# Patient Record
Sex: Male | Born: 1990 | Race: Black or African American | Hispanic: No | Marital: Single | Smoking: Current every day smoker
Health system: Southern US, Community
[De-identification: ages and names within clinical notes are randomized; demographics above are authoritative.]

## PROBLEM LIST (undated history)

## (undated) DIAGNOSIS — K219 Gastro-esophageal reflux disease without esophagitis: Secondary | ICD-10-CM

## (undated) DIAGNOSIS — J45909 Unspecified asthma, uncomplicated: Secondary | ICD-10-CM

---
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
## 2010-02-03 ENCOUNTER — Emergency Department (HOSPITAL_COMMUNITY): Admission: EM | Admit: 2010-02-03 | Discharge: 2010-02-03 | Payer: Self-pay | Admitting: Emergency Medicine

## 2010-08-04 LAB — URINALYSIS, ROUTINE W REFLEX MICROSCOPIC: Glucose, UA: NEGATIVE mg/dL

## 2010-08-04 LAB — URINE MICROSCOPIC-ADD ON

## 2013-08-17 ENCOUNTER — Encounter (HOSPITAL_COMMUNITY): Payer: Self-pay | Admitting: Emergency Medicine

## 2013-08-17 ENCOUNTER — Emergency Department (HOSPITAL_COMMUNITY): Payer: Self-pay

## 2013-08-17 ENCOUNTER — Emergency Department (HOSPITAL_COMMUNITY)
Admission: EM | Admit: 2013-08-17 | Discharge: 2013-08-17 | Disposition: A | Discharge status: Home / Self Care | Payer: Self-pay | Attending: Emergency Medicine | Admitting: Emergency Medicine

## 2013-08-17 DIAGNOSIS — M940 Chondrocostal junction syndrome [Tietze]: Secondary | ICD-10-CM | POA: Insufficient documentation

## 2013-08-17 DIAGNOSIS — F17200 Nicotine dependence, unspecified, uncomplicated: Secondary | ICD-10-CM | POA: Insufficient documentation

## 2013-08-17 MED ORDER — HYDROCODONE-ACETAMINOPHEN 5-325 MG PO TABS
2.0000 | ORAL_TABLET | Freq: Once | ORAL | Status: AC
  Administered 2013-08-17: 2 via ORAL
  Filled 2013-08-17: qty 2

## 2013-08-17 MED ORDER — HYDROCODONE-ACETAMINOPHEN 5-325 MG PO TABS: 1.0000 | ORAL_TABLET | ORAL | Status: DC | PRN

## 2013-08-17 MED ORDER — IBUPROFEN 800 MG PO TABS: 800.0000 mg | ORAL_TABLET | Freq: Three times a day (TID) | ORAL | Status: DC

## 2013-08-17 MED ORDER — KETOROLAC TROMETHAMINE 60 MG/2ML IM SOLN
60.0000 mg | Freq: Once | INTRAMUSCULAR | Status: AC
  Administered 2013-08-17: 60 mg via INTRAMUSCULAR
  Filled 2013-08-17: qty 2

## 2013-08-17 MED ORDER — GI COCKTAIL ~~LOC~~
30.0000 mL | Freq: Once | ORAL | Status: AC
  Administered 2013-08-17: 30 mL via ORAL
  Filled 2013-08-17: qty 30

## 2013-08-17 NOTE — ED Notes (Signed)
The pt has had a cough for approx one week productive cough yellowish thick or thin sputum.  No temp he feels bad

## 2013-08-17 NOTE — ED Notes (Signed)
Onset 2 weeks NP cough.  Onset today mid chest pain when taking deep breath, eating foods and swallowing.  No respiratory difficulties or trouble swallowing saliva.  Lungs clear.

## 2013-08-17 NOTE — ED Provider Notes (Signed)
CSN: [id number]     Arrival date & time 08/17/13  1643 History   First MD Initiated Contact with Patient 08/17/13 1659     Chief Complaint  Patient presents with  . Cough     (Consider location/radiation/quality/duration/timing/severity/associated sxs/prior Treatment) HPI History provided by pt.   Pt presents w/ constant, non-radiating, pleuritic, mid-line CP since this morning.  No aggravating/alleviating factors other than pain increases with po intake.  Associated w/ cough x 2 weeks.  Denies fever, SOB, abd pain, N/V.  No recent trauma or heavy lifting.  No h/o GERD.  Drank a small amount of alcohol last night.  No spicy food, caffeine or NSAIDs recently.  History reviewed. No pertinent past medical history. History reviewed. No pertinent past surgical history. No family history on file. History  Substance Use Topics  . Smoking status: Current Every Day Smoker  . Smokeless tobacco: Not on file  . Alcohol Use: Yes    Review of Systems  All other systems reviewed and are negative.      Allergies  Review of patient's allergies indicates no known allergies.  Home Medications  No current outpatient prescriptions on file. BP 125/70  Pulse 94  Temp(Src) 97.4 F (36.3 C) (Oral)  Resp 18  Wt 144 lb 5 oz (65.46 kg)  SpO2 98% Physical Exam  Nursing note and vitals reviewed. Constitutional: He is oriented to person, place, and time. He appears well-developed and well-nourished.  Uncomfortable appearing  HENT:  Head: Normocephalic and atraumatic.  Eyes:  Normal appearance  Neck: Normal range of motion.  Cardiovascular: Normal rate, regular rhythm and intact distal pulses.   Pulmonary/Chest: Effort normal and breath sounds normal. No respiratory distress.  No pleuritic pain reported.  Tenderness LSB.    Abdominal: Soft. Bowel sounds are normal. He exhibits no distension. There is no tenderness. There is no guarding.  Musculoskeletal: Normal range of motion.  No peripheral  edema or calf tenderness  Neurological: He is alert and oriented to person, place, and time.  Skin: Skin is warm and dry. No rash noted.  Psychiatric: He has a normal mood and affect. His behavior is normal.    ED Course  Procedures (including critical care time) Labs Review Labs Reviewed - No data to display Imaging Review Dg Chest 2 View  08/17/2013   CLINICAL DATA:  Cough for 2 weeks.  Chest pain.  EXAM: CHEST  2 VIEW  COMPARISON:  None.  FINDINGS: Normal heart, mediastinum and hila. Clear lungs. No pleural effusion or pneumothorax. Bony thorax and soft tissues are normal.  IMPRESSION: Normal chest radiographs.   Electronically Signed   By: Amie Portlandavid  Ormond M.D.   On: 08/17/2013 18:43     EKG Interpretation None      MDM   Final diagnoses:  Costochondritis    22yo M presents w/ non-traumatic CP since this am.  H/o GERD but this pain feels different.  Cough x 2 weeks.  Afebrile, NAD, nml breath sounds, tenderness LSB, no edema/tenderness BLE on exam.  CXR unremarkable.   Initially thought pain secondary to costochondritis d/t cough and reproducibility.  Treated w/ toradol and vicodin w/ some improvement.  I have some suspicion for GERD now as well.  He received a GI cocktail and I recommended that he take prilosec qd x 14d, especially while on an NSAID.  Prescribed short course of vicodin as well.  Return precautions discussed.   Otilio Miuatherine E Gaia Gullikson, PA-C 08/19/13 0630

## 2013-08-17 NOTE — Discharge Instructions (Signed)
Take vicodin as prescribed for severe pain.  Do not drive within four hours of taking this medication (may cause drowsiness or confusion).   Take ibuprofen as well; up to 800mg  three times a day with food.   Ice 2-3 times a day for 15-20 minutes.  Avoid activities that aggravate pain.  Please return to the ER if your pain worsens or you develop associated fever or shortness of breath.  Costochondritis Costochondritis, sometimes called Tietze syndrome, is a swelling and irritation (inflammation) of the tissue (cartilage) that connects your ribs with your breastbone (sternum). It causes pain in the chest and rib area. Costochondritis usually goes away on its own over time. It can take up to 6 weeks or longer to get better, especially if you are unable to limit your activities. CAUSES  Some cases of costochondritis have no known cause. Possible causes include:  Injury (trauma).  Exercise or activity such as lifting.  Severe coughing. SIGNS AND SYMPTOMS  Pain and tenderness in the chest and rib area.  Pain that gets worse when coughing or taking deep breaths.  Pain that gets worse with specific movements. DIAGNOSIS  Your health care provider will do a physical exam and ask about your symptoms. Chest X-rays or other tests may be done to rule out other problems. TREATMENT  Costochondritis usually goes away on its own over time. Your health care provider may prescribe medicine to help relieve pain. HOME CARE INSTRUCTIONS   Avoid exhausting physical activity. Try not to strain your ribs during normal activity. This would include any activities using chest, abdominal, and side muscles, especially if heavy weights are used.  Apply ice to the affected area for the first 2 days after the pain begins.  Put ice in a plastic bag.  Place a towel between your skin and the bag.  Leave the ice on for 20 minutes, 2 3 times a day.  Only take over-the-counter or prescription medicines as directed by  your health care provider. SEEK MEDICAL CARE IF:  You have redness or swelling at the rib joints. These are signs of infection.  Your pain does not go away despite rest or medicine. SEEK IMMEDIATE MEDICAL CARE IF:   Your pain increases or you are very uncomfortable.  You have shortness of breath or difficulty breathing.  You cough up blood.  You have worse chest pains, sweating, or vomiting.  You have a fever or persistent symptoms for more than 2 3 days.  You have a fever and your symptoms suddenly get worse. MAKE SURE YOU:   Understand these instructions.  Will watch your condition.  Will get help right away if you are not doing well or get worse. Document Released: 02/15/2005 Document Revised: 02/26/2013 Document Reviewed: 12/10/2012 [facility] Patient Information 2014 [place], [place].

## 2013-08-20 NOTE — ED Provider Notes (Signed)
Medical screening examination/treatment/procedure(s) were performed by non-physician practitioner and as supervising physician I was immediately available for consultation/collaboration.   Date: 08/20/2013  Rate: 76  Rhythm: normal sinus rhythm with early repolarization  QRS Axis: normal  Intervals: normal  ST/T Wave abnormalities: nonspecific T wave changes  Conduction Disutrbances:none  Narrative Interpretation:   Old EKG Reviewed: none available     Geoffery Lyonsouglas Haroon Shatto, MD 08/20/13 2353

## 2013-08-29 ENCOUNTER — Emergency Department (HOSPITAL_COMMUNITY): Payer: Self-pay

## 2013-08-29 ENCOUNTER — Encounter (HOSPITAL_COMMUNITY): Payer: Self-pay | Admitting: Emergency Medicine

## 2013-08-29 ENCOUNTER — Emergency Department (HOSPITAL_COMMUNITY)
Admission: EM | Admit: 2013-08-29 | Discharge: 2013-08-29 | Disposition: A | Discharge status: Home / Self Care | Payer: Self-pay | Attending: Emergency Medicine | Admitting: Emergency Medicine

## 2013-08-29 DIAGNOSIS — F17200 Nicotine dependence, unspecified, uncomplicated: Secondary | ICD-10-CM | POA: Insufficient documentation

## 2013-08-29 DIAGNOSIS — M7650 Patellar tendinitis, unspecified knee: Secondary | ICD-10-CM | POA: Insufficient documentation

## 2013-08-29 DIAGNOSIS — M7651 Patellar tendinitis, right knee: Secondary | ICD-10-CM

## 2013-08-29 DIAGNOSIS — Z8719 Personal history of other diseases of the digestive system: Secondary | ICD-10-CM | POA: Insufficient documentation

## 2013-08-29 DIAGNOSIS — J45909 Unspecified asthma, uncomplicated: Secondary | ICD-10-CM | POA: Insufficient documentation

## 2013-08-29 HISTORY — DX: Gastro-esophageal reflux disease without esophagitis: K21.9

## 2013-08-29 HISTORY — DX: Unspecified asthma, uncomplicated: J45.909

## 2013-08-29 MED ORDER — IBUPROFEN 400 MG PO TABS
800.0000 mg | ORAL_TABLET | Freq: Once | ORAL | Status: AC
  Administered 2013-08-29: 800 mg via ORAL
  Filled 2013-08-29: qty 2

## 2013-08-29 MED ORDER — IBUPROFEN 800 MG PO TABS: 800.0000 mg | ORAL_TABLET | Freq: Three times a day (TID) | ORAL | Status: DC

## 2013-08-29 NOTE — ED Notes (Signed)
Patient states his knee "just started hurting today.  I was at work.   I didn't do anything to it".  Patient denies injury.

## 2013-08-29 NOTE — ED Notes (Signed)
Called nurse 1st to have patient's father come back to his room.
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
## 2013-08-29 NOTE — Discharge Instructions (Signed)
Please follow up with your primary care physician in 1-2 days. If you do not have one please call the [facility] number listed above. Please follow up with Dr. Eulah Pont to schedule a follow up appointment. Please follow RICE method below. Please read all discharge instructions and return precautions.     Patellar Tendinitis, Jumper's Knee with Rehab Tendinitis is inflammation of a tendon. Tendonitis of the tendon below the kneecap (patella) is known as patellar tendonitis. Patellar tendonitis is a common cause of pain below the kneecap (infrapatella). Patellar tendonitis may involve a tear (strain) in the ligament. Strains are classified into three categories. Grade 1 strains cause pain, but the tendon is not lengthened. Grade 2 strains include a lengthened ligament, due to the ligament being stretched or partially ruptured. With grade 2 strains there is still function, although function may be decreased. Grade 3 strains involve a complete tear of the tendon or muscle, and function is usually impaired. Patellar tendon strains are usually grade 1 or 2.  SYMPTOMS   Pain, tenderness, swelling, warmth, or redness over the patellar tendon (just below the kneecap).  Pain and loss of strength (sometimes), with forcefully straightening the knee (especially when jumping or rising from a seated or squatting position), or bending the knee completely (squatting or kneeling).  Crackling sound (crepitation) when the tendon is moved or touched. CAUSES  Patellar tendonitis is caused by injury to the patellar tendon. The inflammation is the body's healing response. Common causes of injury include:  Stress from a sudden increase in intensity, frequency, or duration of training.  Overuse of the quadriceps thigh muscles and patellar tendon.  Direct hit (trauma) to the knee or patellar tendon. RISK INCREASES WITH:  Sports that require sudden, explosive thigh muscle (quadriceps) contraction,  such as jumping, quick starts, or kicking.  Running sports, especially running down hills.  Poor strength and flexibility of the thigh and knee.  Flat feet. PREVENTION  Warm up and stretch properly before activity.  Allow for adequate recovery between workouts.  Maintain physical fitness:  Strength, flexibility, and endurance.  Cardiovascular fitness.  Protect the knee joint with taping, protective strapping, bracing, or elastic compression bandage.  Wear arch supports (orthotics). PROGNOSIS  If treated properly, patellar tendonitis usually heals within 6 weeks.  RELATED COMPLICATIONS   Longer healing time, if not properly treated or if not given enough time to heal.  Recurring symptoms, if activity is resumed too soon, with overuse, with a direct blow, or when using poor technique.  If untreated, tendon rupture requiring surgery. TREATMENT Treatment first involves the use of ice and medicine, to reduce pain and inflammation. The use of strengthening and stretching exercises may help reduce pain with activity. These exercises may be performed at home or with a therapist. Serious cases of tendonitis may require restraining the knee for 10 to 14 days, to prevent stress on the tendon and to promote healing. Crutches may be used (uncommon) until you can walk without a limp. For cases in which non-surgical treatment is unsuccessful, surgery may be advised, to remove the inflamed tendon lining (sheath). Surgery is rare, and is only advised after at least 6 months of non-surgical treatment. MEDICATION   If pain medicine is needed, nonsteroidal anti-inflammatory medicines (aspirin and ibuprofen), or other minor pain relievers (acetaminophen), are often advised.  Do not take pain medicine for 7 days before surgery.  Prescription pain relievers may be given, if your caregiver thinks they are needed. Use only as  directed and only as much as you need. HEAT AND COLD  Cold treatment  (icing) should be applied for 10 to 15 minutes every 2 to 3 hours for inflammation and pain, and immediately after activity that aggravates your symptoms. Use ice packs or an ice massage.  Heat treatment may be used before performing stretching and strengthening activities prescribed by your caregiver, physical therapist, or athletic trainer. Use a heat pack or a warm water soak. SEEK MEDICAL CARE IF:  Symptoms get worse or do not improve in 2 weeks, despite treatment.  New, unexplained symptoms develop. (Drugs used in treatment may produce side effects.) EXERCISES RANGE OF MOTION (ROM) AND STRETCHING EXERCISES - Patellar Tendinitis (Jumper's Knee) These are some of the initial exercises with which you may start your rehabilitation program, until you see your caregiver again or until your symptoms are resolved. Remember:   Flexible tissue is more tolerant of the stresses placed on it during activities.  Each stretch should be held for 20 to 30 seconds.  A gentle stretching sensation should be felt. STRETCH Hamstrings, Supine  Lie on your back. Loop a belt or towel over the ball of your right / left foot.  Straighten your right / left knee and slowly pull on the belt to raise your leg. Do not allow the right / left knee to bend. Keep your opposite leg flat on the floor.  Raise the leg until you feel a gentle stretch behind your right / left knee or thigh. Hold this position for __________ seconds. Repeat __________ times. Complete this stretch __________ times per day.  STRETCH - Hamstrings, Doorway  Lie on your back with your right / left leg extended and resting on the wall, and the opposite leg flat on the ground through the door. At first, position your bottom farther away from the wall.  Keep your right / left knee straight. If you feel a stretch behind your knee or thigh, hold this position for __________ seconds.  If you do not feel a stretch, scoot your bottom closer to the  door, and hold __________ seconds. Repeat __________ times. Complete this stretch __________ times per day.  STRETCH - Hamstrings, Standing  Stand or sit and extend your right / left leg, placing your foot on a chair or foot stool.  Keep a slight arch in your low back and your hips straight forward.  Lead with your chest and lean forward at the waist until you feel a gentle stretch in the back of your right / left knee or thigh. (When done correctly, this exercise requires leaning only a small distance.)  Hold this position for __________ seconds. Repeat __________ times. Complete this stretch __________ times per day. STRETCH - Adductors, Lunge  While standing, spread your legs, with your right / left leg behind you.  Lean away from your right / left leg by bending your opposite knee. You may rest your hands on your thigh for balance.  You should feel a stretch in your right / left inner thigh. Hold for __________ seconds. Repeat __________ times. Complete this exercise __________ times per day.  STRENGTHENING EXERCISES - Patellar Tendinitis (Jumper's Knee) These exercises may help you when beginning to rehabilitate your injury. They may resolve your symptoms with or without further involvement from your physician, physical therapist or athletic trainer. While completing these exercises, remember:   Muscles can gain both the endurance and the strength needed for everyday activities through controlled exercises.  Complete these exercises as  instructed by your physician, physical therapist or athletic trainer. Increase the resistance and repetitions only as guided by your caregiver. STRENGTH - Quadriceps, Isometrics  Lie on your back with your right / left leg extended and your opposite knee bent.  Gradually tense the muscles in the front of your right / left thigh. You should see either your kneecap slide up toward your hip or increased dimpling just above the knee. This motion will  push the back of the knee down toward the floor, mat, or bed on which you are lying.  Hold the muscle as tight as you can, without increasing your pain, for __________ seconds.  Relax the muscles slowly and completely in between each repetition. Repeat __________ times. Complete this exercise __________ times per day.  STRENGTH - Quadriceps, Short Arcs  Lie on your back. Place a __________ inch towel roll under your right / left knee, so that the knee bends slightly.  Raise only your lower leg by tightening the muscles in the front of your thigh. Do not allow your thigh to rise.  Hold this position for __________ seconds. Repeat __________ times. Complete this exercise __________ times per day.  OPTIONAL ANKLE WEIGHTS: Begin with ____________________, but DO NOT exceed ____________________. Increase in 1 pound/ 0.5 kilogram increments. STRENGTH - Quadriceps, Straight Leg Raises  Quality counts! Watch for signs that the quadriceps muscle is working, to be sure you are strengthening the correct muscles and not "cheating" by substituting with healthier muscles.  Lay on your back with your right / left leg extended and your opposite knee bent.  Tense the muscles in the front of your right / left thigh. You should see either your kneecap slide up or increased dimpling just above the knee. Your thigh may even shake a bit.  Tighten these muscles even more and raise your leg 4 to 6 inches off the floor. Hold for __________ seconds.  Keeping these muscles tense, lower your leg.  Relax the muscles slowly and completely between each repetition. Repeat __________ times. Complete this exercise __________ times per day.  STRENGTH  Quadriceps, Squats  Stand in a door frame so that your feet and knees are in line with the frame.  Use your hands for balance, not support, on the frame.  Slowly lower your weight, bending at the hips and knees. Keep your lower legs upright so that they are parallel  with the door frame. Squat only within the range that does not increase your knee pain. Never let your hips drop below your knees.  Slowly return upright, pushing with your legs, not pulling with your hands. Repeat __________ times. Complete this exercise __________ times per day.  STRENGTH  Quadriceps, Step-Downs  Stand on the edge of a step stool or stair. Be prepared to use a countertop or wall for balance, if needed.  Keeping your right / left knee directly over the middle of your foot, slowly touch your opposite heel to the floor or lower step. Do not go all the way to the floor if your knee pain increases, just go as far as you can without increased discomfort. Use your right / left leg muscles, not gravity to lower your body weight.  Slowly push your body weight back up to the starting position, Repeat __________ times. Complete this exercise __________ times per day.  Document Released: 05/08/2005 Document Revised: 07/31/2011 Document Reviewed: 08/20/2008 [facility] Patient Information 2014 [place], [place]. RICE: Routine Care for Injuries The routine care of many injuries includes  Rest, Ice, Compression, and Elevation (RICE). HOME CARE INSTRUCTIONS  Rest is needed to allow your body to heal. Routine activities can usually be resumed when comfortable. Injured tendons and bones can take up to 6 weeks to heal. Tendons are the cord-like structures that attach muscle to bone.  Ice following an injury helps keep the swelling down and reduces pain.  Put ice in a plastic bag.  Place a towel between your skin and the bag.  Leave the ice on for 15-20 minutes, 03-04 times a day. Do this while awake, for the first 24 to 48 hours. After that, continue as directed by your caregiver.  Compression helps keep swelling down. It also gives support and helps with discomfort. If an elastic bandage has been applied, it should be removed and reapplied every 3 to 4 hours. It should not be applied tightly,  but firmly enough to keep swelling down. Watch fingers or toes for swelling, bluish discoloration, coldness, numbness, or excessive pain. If any of these problems occur, remove the bandage and reapply loosely. Contact your caregiver if these problems continue.  Elevation helps reduce swelling and decreases pain. With extremities, such as the arms, hands, legs, and feet, the injured area should be placed near or above the level of the heart, if possible. SEEK IMMEDIATE MEDICAL CARE IF:  You have persistent pain and swelling.  You develop redness, numbness, or unexpected weakness.  Your symptoms are getting worse rather than improving after several days. These symptoms may indicate that further evaluation or further X-rays are needed. Sometimes, X-rays may not show a small broken bone (fracture) until 1 week or 10 days later. Make a follow-up appointment with your caregiver. Ask when your X-ray results will be ready. Make sure you get your X-ray results. Document Released: 08/20/2000 Document Revised: 07/31/2011 Document Reviewed: 10/07/2010 [facility] Patient Information 2014 [place]

## 2013-09-01 NOTE — ED Provider Notes (Signed)
Medical screening examination/treatment/procedure(s) were performed by non-physician practitioner and as supervising physician I was immediately available for consultation/collaboration.   EKG Interpretation None        Gwyneth SproutWhitney Senia Even, MD 09/01/13 [phone]

## 2013-09-23 ENCOUNTER — Encounter (HOSPITAL_COMMUNITY): Payer: Self-pay | Admitting: Emergency Medicine

## 2013-09-23 ENCOUNTER — Emergency Department (HOSPITAL_COMMUNITY)
Admission: EM | Admit: 2013-09-23 | Discharge: 2013-09-24 | Disposition: A | Discharge status: Home / Self Care | Payer: Self-pay | Attending: Emergency Medicine | Admitting: Emergency Medicine

## 2013-09-23 DIAGNOSIS — M6282 Rhabdomyolysis: Secondary | ICD-10-CM | POA: Insufficient documentation

## 2013-09-23 DIAGNOSIS — R110 Nausea: Secondary | ICD-10-CM | POA: Insufficient documentation

## 2013-09-23 DIAGNOSIS — F17200 Nicotine dependence, unspecified, uncomplicated: Secondary | ICD-10-CM | POA: Insufficient documentation

## 2013-09-23 DIAGNOSIS — E860 Dehydration: Secondary | ICD-10-CM | POA: Insufficient documentation

## 2013-09-23 DIAGNOSIS — Z8719 Personal history of other diseases of the digestive system: Secondary | ICD-10-CM | POA: Insufficient documentation

## 2013-09-23 DIAGNOSIS — N289 Disorder of kidney and ureter, unspecified: Secondary | ICD-10-CM | POA: Insufficient documentation

## 2013-09-23 DIAGNOSIS — J45909 Unspecified asthma, uncomplicated: Secondary | ICD-10-CM | POA: Insufficient documentation

## 2013-09-23 DIAGNOSIS — Z791 Long term (current) use of non-steroidal anti-inflammatories (NSAID): Secondary | ICD-10-CM | POA: Insufficient documentation

## 2013-09-23 LAB — BASIC METABOLIC PANEL
BUN: 30 mg/dL — ABNORMAL HIGH (ref 6–23)
CO2: 22 mEq/L (ref 19–32)
CREATININE: 1.87 mg/dL — AB (ref 0.50–1.35)
Calcium: 10.7 mg/dL — ABNORMAL HIGH (ref 8.4–10.5)
Chloride: 93 mEq/L — ABNORMAL LOW (ref 96–112)
GFR, EST AFRICAN AMERICAN: 57 mL/min — AB (ref >90)
GFR, EST NON AFRICAN AMERICAN: 49 mL/min — AB (ref >90)
GLUCOSE: 119 mg/dL — AB (ref 70–99)
Potassium: 3.9 mEq/L (ref 3.7–5.3)
Sodium: 135 mEq/L — ABNORMAL LOW (ref 137–147)

## 2013-09-23 LAB — URINALYSIS, ROUTINE W REFLEX MICROSCOPIC
Glucose, UA: NEGATIVE mg/dL
KETONES UR: NEGATIVE mg/dL
LEUKOCYTES UA: NEGATIVE
Nitrite: NEGATIVE
Protein, ur: >300 mg/dL — AB
SPECIFIC GRAVITY, URINE: 1.03 (ref 1.005–1.030)
UROBILINOGEN UA: 0.2 mg/dL (ref 0.0–1.0)
pH: 5 (ref 5.0–8.0)

## 2013-09-23 LAB — URINE MICROSCOPIC-ADD ON

## 2013-09-23 LAB — CK: Total CK: 2158 U/L — ABNORMAL HIGH (ref 7–232)

## 2013-09-23 MED ORDER — ONDANSETRON 4 MG PO TBDP: 4.0000 mg | ORAL_TABLET | Freq: Three times a day (TID) | ORAL | Status: AC | PRN

## 2013-09-23 MED ORDER — SODIUM CHLORIDE 0.9 % IV BOLUS (SEPSIS)
1000.0000 mL | Freq: Once | INTRAVENOUS | Status: AC
  Administered 2013-09-23: 1000 mL via INTRAVENOUS

## 2013-09-23 MED ORDER — KETOROLAC TROMETHAMINE 30 MG/ML IJ SOLN
30.0000 mg | Freq: Once | INTRAMUSCULAR | Status: AC
  Administered 2013-09-23: 30 mg via INTRAVENOUS
  Filled 2013-09-23: qty 1

## 2013-09-23 MED ORDER — ONDANSETRON HCL 4 MG/2ML IJ SOLN
4.0000 mg | Freq: Once | INTRAMUSCULAR | Status: AC
  Administered 2013-09-23: 4 mg via INTRAVENOUS
  Filled 2013-09-23: qty 2

## 2013-09-23 NOTE — ED Provider Notes (Signed)
CSN: [id number]     Arrival date & time 09/23/13  2011 History   First MD Initiated Contact with Patient 09/23/13 2050     Chief Complaint  Patient presents with  . 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  This seems to be worsening throughout the day.  Associated nausea present - denies dark urine, cough, dysuria, diarrhea, rash or other c/o.    The history is provided by the patient.    Past Medical History  Diagnosis Date  . GERD (gastroesophageal reflux disease)   . Asthma    History reviewed. No pertinent past surgical history. Family History  Problem Relation Age of Onset  . Diabetes Other   . Cancer Other    History  Substance Use Topics  . Smoking status: Current Every Day Smoker    Types: Cigarettes  . Smokeless tobacco: Not on file  . Alcohol Use: 1.2 oz/week    2 Cans of beer per week     Comment: daily    Review of Systems  All other systems reviewed and are negative.     Allergies  Asa  Home Medications   Prior to Admission medications   Medication Sig Start Date End Date Taking? Authorizing Provider  ibuprofen (ADVIL,MOTRIN) 800 MG tablet Take 1 tablet (800 mg total) by mouth 3 (three) times daily. 08/29/13   Jennifer L Piepenbrink, PA-C   BP 132/78  Pulse 107  Temp(Src) 98.7 F (37.1 C) (Oral)  Resp 18  SpO2 98% Physical Exam  Nursing note and vitals reviewed. Constitutional: He appears well-developed and well-nourished. No distress.  HENT:  Head: Normocephalic and atraumatic.  Mouth/Throat: Oropharynx is clear and moist. No oropharyngeal exudate.  Eyes:  Conjunctivae and EOM are normal. Pupils are equal, round, and reactive to light. Right eye exhibits no discharge. Left eye exhibits no discharge. No scleral icterus.  Neck: Normal range of motion. Neck supple. No JVD present. No thyromegaly present.  Cardiovascular: Normal rate, regular rhythm, normal heart sounds and intact distal pulses.  Exam reveals no gallop and no friction rub.   No murmur heard. No tachycardia on my exam  Pulmonary/Chest: Effort normal and breath sounds normal. No respiratory distress. He has no wheezes. He has no rales.  Abdominal: Soft. Bowel sounds are normal. He exhibits no distension and no mass. There is no tenderness.  Musculoskeletal: Normal range of motion. He exhibits no edema and no tenderness.  No palpable ttp in the muscles of the arms or legs or back.  Lymphadenopathy:    He has no cervical adenopathy.  Neurological: He is alert. Coordination normal.  Skin: Skin is warm and dry. No rash noted. No erythema.  Psychiatric: He has a normal mood and affect. His behavior is normal.    ED Course  Procedures (including critical care time) Labs Review Labs Reviewed  CK - Abnormal; Notable for the following:    Total CK 2158 (*)    All other components within normal limits  BASIC METABOLIC PANEL - Abnormal; Notable for the following:    Sodium 135 (*)    Chloride 93 (*)  Glucose, Bld 119 (*)    BUN 30 (*)    Creatinine, Ser 1.87 (*)    Calcium 10.7 (*)    GFR calc non Af Amer 49 (*)    GFR calc Af Amer 57 (*)    All other components within normal limits  URINALYSIS, ROUTINE W REFLEX MICROSCOPIC - Abnormal; Notable for the following:    APPearance CLOUDY (*)    Hgb urine dipstick MODERATE (*)    Bilirubin Urine SMALL (*)    Protein, ur >300 (*)    All other components within normal limits  URINE MICROSCOPIC-ADD ON - Abnormal; Notable for the following:    Casts HYALINE CASTS (*)    Crystals CA OXALATE CRYSTALS (*)    All other components within  normal limits    Imaging Review No results found.    MDM   Final diagnoses:  Rhabdomyolysis  Renal insufficiency  Dehydration    VS without concern, pt history suggestive of dehydration / less likely rhabdo, no fever.  Fluids, zofran, UA  The patient's laboratory workup she confirms that he has mild rhabdomyolysis, mild renal insufficiency with proteinuria which I suspect is related to dehydration. He has been given 2 L of IV fluids, Zofran and his symptoms have completely resolved. I encouraged him to use Tylenol and stay away from anti-inflammatories and have his blood work rechecked within 24 hours to ensure that he is improving. The patient is in agreement and has expressed his understanding to the followup plan. Her the date of her  Meds given in ED:  Medications  sodium chloride 0.9 % bolus 1,000 mL (0 mLs Intravenous Stopped 09/23/13 2227)  ketorolac (TORADOL) 30 MG/ML injection 30 mg (30 mg Intravenous Given 09/23/13 2123)  ondansetron (ZOFRAN) injection 4 mg (4 mg Intravenous Given 09/23/13 2123)  sodium chloride 0.9 % bolus 1,000 mL (1,000 mLs Intravenous New Bag/Given 09/23/13 2227)    New Prescriptions   ONDANSETRON (ZOFRAN ODT) 4 MG DISINTEGRATING TABLET    Take 1 tablet (4 mg total) by mouth every 8 (eight) hours as needed for nausea.      Vida RollerBrian D Brittin Belnap, MD 09/23/13 [phone]

## 2013-09-23 NOTE — ED Notes (Signed)
Pt. Made aware for the need of urine. 

## 2013-09-23 NOTE — ED Notes (Signed)
Pt reports generalized muscle cramping while he was at work today.  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  States that he has been drinking water, monster drinks and gatorade all day.  He also reports that his boss instructed him to eat salt.  Pt reports eating 5 small packs of salt.  He also reports that the last time he urinated was at lunch.

## 2013-09-23 NOTE — ED Notes (Signed)
Per EMS pt states he is having muscle cramps that started about 1400 and have gotten worse  Pt works outside  Kinder Morgan EnergyPt states he is cramping all over and describes as intermittent spasms

## 2013-09-23 NOTE — Discharge Instructions (Signed)
Your testing today shows that you have had some kidney breakdown and some muscle breakdown.    Please bring this paperwork with you when you follow up in 24 hours  Your creatine kinase level was approximately 2100  Your creatinine level was 1.87  These levels need to be checked within 24 hours. Until that time drink plenty of fluids, water or Gatorade R. optimal. Stay away from beer, liquor or alcohol of any kind and stay away from soda.

## 2021-07-10 ENCOUNTER — Encounter (HOSPITAL_COMMUNITY): Payer: Self-pay | Admitting: Emergency Medicine

## 2021-07-10 ENCOUNTER — Emergency Department (HOSPITAL_COMMUNITY)
Admission: EM | Admit: 2021-07-10 | Discharge: 2021-07-10 | Disposition: A | Discharge status: Home / Self Care | Payer: Self-pay | Attending: Emergency Medicine | Admitting: Emergency Medicine

## 2021-07-10 ENCOUNTER — Other Ambulatory Visit: Payer: Self-pay

## 2021-07-10 DIAGNOSIS — Z711 Person with feared health complaint in whom no diagnosis is made: Secondary | ICD-10-CM

## 2021-07-10 DIAGNOSIS — N4889 Other specified disorders of penis: Secondary | ICD-10-CM | POA: Insufficient documentation

## 2021-07-10 DIAGNOSIS — Z202 Contact with and (suspected) exposure to infections with a predominantly sexual mode of transmission: Secondary | ICD-10-CM | POA: Insufficient documentation

## 2021-07-10 LAB — RAPID HIV SCREEN (HIV 1/2 AB+AG)
HIV 1/2 Antibodies: NONREACTIVE
HIV-1 P24 Antigen - HIV24: NONREACTIVE

## 2021-07-10 NOTE — ED Provider Notes (Signed)
[facility]-EMERGENCY DEPT Provider Note   CSN: [id number] Arrival date & time: 07/10/21  1258     History  Chief Complaint  Patient presents with   penile rash    Tyler Rush is a 31 y.o. male.  HPI    Pt comes in w/ cc of rash. Patient indicates that yesterday afternoon while having intercourse he started having some discomfort over the penile head.  He noticed some changes on the penile head, got concerned for rash.  There is some discomfort over the penile head when he rubs it on something.  He denies any penile drainage.  No burning with urination.  He has 1 partner with whom he has been monogamous.  He has had GC and chlamydia in the past.  Home Medications Prior to Admission medications   Medication Sig Start Date End Date Taking? Authorizing Provider  naproxen sodium (ANAPROX) 220 MG tablet Take 220 mg by mouth as needed (pain/ headache).    [provider]  ondansetron (ZOFRAN ODT) 4 MG disintegrating tablet Take 1 tablet (4 mg total) by mouth every 8 (eight) hours as needed for nausea. 09/23/13   Eber Hong, MD      Allergies    Jonne Ply [aspirin]    Review of Systems   Review of Systems  Constitutional:  Positive for activity change.  Genitourinary:  Positive for penile pain. Negative for dysuria.   Physical Exam Updated Vital Signs BP 123/78 (BP Location: Right Arm)    Pulse 93    Temp 98.2 F (36.8 C) (Oral)    Resp 18    Ht 5\' 9"  (1.753 m)    Wt 81.6 kg    SpO2 98%    BMI 26.58 kg/m  Physical Exam Vitals and nursing note reviewed.  Constitutional:      Appearance: He is well-developed.  HENT:     Head: Atraumatic.  Cardiovascular:     Rate and Rhythm: Normal rate.  Pulmonary:     Effort: Pulmonary effort is normal.  Genitourinary:    Comments: No inguinal lymphadenopathy, no rash over the scrotal skin or penis itself.  Around the urethra, patient has some areas of striation -but no actual rash appreciated.  No  swelling of the penile head. Musculoskeletal:     Cervical back: Neck supple.  Skin:    General: Skin is warm.  Neurological:     Mental Status: He is alert and oriented to person, place, and time.    ED Results / Procedures / Treatments   Labs (all labs ordered are listed, but only abnormal results are displayed) Labs Reviewed  RPR  RAPID HIV SCREEN (HIV 1/2 AB+AG)  GC/CHLAMYDIA PROBE AMP ([place]) NOT AT [facility]    EKG None  Radiology No results found.  Procedures Procedures    Medications Ordered in ED Medications - No data to display  ED Course/ Medical Decision Making/ A&P                           Medical Decision Making Amount and/or Complexity of Data Reviewed Labs: ordered.   31 year old male comes in a chief complaint of STD concerns  He has prior history of chlamydia, indicates that he is not having high risk behavior at this point.  He noted some discomfort around his urethra/penile head and wanted to make sure he did not have any STD.  On exam, there is no actual rash. No drainage appreciated  around the urethra.  We did get GC and chlamydia swab.  We will get RPR and HIV screen as patient is concerned for STI.  No treatment empirically at this point.  I suspect that most likely he has effects of dryness, and we have advised no intercourse for the next few days and then using lubricants.  Final Clinical Impression(s) / ED Diagnoses Final diagnoses:  Concern about STD in male without diagnosis    Rx / DC Orders ED Discharge Orders     None         Derwood Kaplan, MD 07/10/21 1413

## 2021-07-10 NOTE — Discharge Instructions (Signed)
No intercourse for 5 days. Use lubricant when you start having intercourse. You will be called about the test results ONLY if they are positive.

## 2021-07-10 NOTE — ED Notes (Addendum)
1450:Lab notified this nurse of negative (non reactive) result for patients rapid HIV test. Patient notified prior to discharge contact would only be made if result positive. No contact made with patient at this time.

## 2021-07-10 NOTE — ED Triage Notes (Signed)
31 yo male presents c/o rash on penis x 1 day. Pt denies this being a recurrent issue. Pt denies any urinary complaints, but pt endorses blood tinged discharge from head of penis. Pt states that he has mild pain when he boxers rub against the area rating it 5/10.Pt states rash is located on the head of the penis. Pt states that he is sexually active with one partner and they do not use protection. Pt states that he has had chlamydia several years ago but otherwise no other STD that he knows of.

## 2021-07-11 LAB — GC/CHLAMYDIA PROBE AMP (~~LOC~~) NOT AT ARMC
Chlamydia: NEGATIVE
Comment: NEGATIVE
Comment: NORMAL
Neisseria Gonorrhea: NEGATIVE

## 2021-07-11 LAB — RPR: RPR Ser Ql: NONREACTIVE

## 2021-07-16 ENCOUNTER — Encounter (HOSPITAL_COMMUNITY): Payer: Self-pay | Admitting: *Deleted

## 2021-07-16 ENCOUNTER — Emergency Department (HOSPITAL_COMMUNITY)
Admission: EM | Admit: 2021-07-16 | Discharge: 2021-07-16 | Disposition: A | Discharge status: Home / Self Care | Payer: Self-pay | Attending: Emergency Medicine | Admitting: Emergency Medicine

## 2021-07-16 ENCOUNTER — Other Ambulatory Visit: Payer: Self-pay

## 2021-07-16 DIAGNOSIS — H1089 Other conjunctivitis: Secondary | ICD-10-CM | POA: Insufficient documentation

## 2021-07-16 DIAGNOSIS — Z79899 Other long term (current) drug therapy: Secondary | ICD-10-CM | POA: Insufficient documentation

## 2021-07-16 DIAGNOSIS — H1033 Unspecified acute conjunctivitis, bilateral: Secondary | ICD-10-CM

## 2021-07-16 MED ORDER — FLUORESCEIN SODIUM 1 MG OP STRP
1.0000 | ORAL_STRIP | Freq: Once | OPHTHALMIC | Status: AC
  Administered 2021-07-16: 1 via OPHTHALMIC
  Filled 2021-07-16: qty 1

## 2021-07-16 MED ORDER — TETRACAINE HCL 0.5 % OP SOLN
2.0000 [drp] | Freq: Once | OPHTHALMIC | Status: AC
  Administered 2021-07-16: 2 [drp] via OPHTHALMIC
  Filled 2021-07-16: qty 4

## 2021-07-16 MED ORDER — ERYTHROMYCIN 5 MG/GM OP OINT
TOPICAL_OINTMENT | Freq: Once | OPHTHALMIC | Status: AC
  Filled 2021-07-16: qty 3.5

## 2021-07-16 NOTE — ED Notes (Signed)
Visual Acuity in Right eye was 20/40 Visual Acuity in Left eye was 20/50

## 2021-07-16 NOTE — Discharge Instructions (Addendum)
[facility] to be evaluated for your eye pain and discharge.  Your physical exam is consistent with bacterial conjunctivitis.  You were given erythromycin eye ointment in the emergency department.  Please apply 0.5 inch of ointment inside the lower lid of each eye 4 times a day for the next 7 days.  Your symptoms should begin to improve after 1 to 2 days.  If your symptoms are not improving please follow-up with the ophthalmologist listed on this paperwork.  Get help right away if: You have a fever and your symptoms get worse all of a sudden. You have very bad pain when you move your eye. Your face: Hurts. Is red. Is swollen. You have sudden loss of vision.

## 2021-07-16 NOTE — ED Triage Notes (Signed)
Bilateral red eyes, drainage, irritation since yesterday.

## 2021-07-16 NOTE — ED Provider Notes (Signed)
[facility] DEPT Provider Note   CSN: [id number] Arrival date & time: 07/16/21  2023     History  Chief Complaint  Patient presents with   Eye Problem    Tyler Rush is a 31 y.o. male with past medical history of asthma and GERD.  Presents to the emergency department with a chief complaint of bilateral eye pain and discharge.  Patient reports that yesterday morning he started developing itching to bilateral eyes.  Patient denies any redness or discharge at that time.  Patient states that he woke up this morning and had redness to bilateral eyes, eye irritation, and thick green discharge from bilateral eyes.  Patient reports that he is having blurry vision associated with his symptoms.  Patient denies any fever, chills, diplopia, vision loss, facial swelling.  Patient does not wear glasses or contacts.  Denies any known foreign bodies or chemicals posterior to eyes.   Eye Problem Associated symptoms: discharge, itching, photophobia and redness   Associated symptoms: no headaches, no nausea and no vomiting       Home Medications Prior to Admission medications   Medication Sig Start Date End Date Taking? Authorizing Provider  naproxen sodium (ANAPROX) 220 MG tablet Take 220 mg by mouth as needed (pain/ headache).    [provider]  ondansetron (ZOFRAN ODT) 4 MG disintegrating tablet Take 1 tablet (4 mg total) by mouth every 8 (eight) hours as needed for nausea. 09/23/13   Noemi Chapel, MD      Allergies    Diona Fanti [aspirin]    Review of Systems   Review of Systems  Constitutional:  Negative for chills and fever.  HENT:  Negative for facial swelling.   Eyes:  Positive for photophobia, pain, discharge, redness, itching and visual disturbance.  Respiratory:  Negative for cough and shortness of breath.   Gastrointestinal:  Negative for nausea and vomiting.  Musculoskeletal:  Negative for neck pain and neck stiffness.  Neurological:   Negative for headaches.   Physical Exam Updated Vital Signs BP 139/78 (BP Location: Right Arm)    Pulse (!) 116    Temp 98.1 F (36.7 C) (Oral)    Resp 16    Ht 5\' 9"  (1.753 m)    Wt 81.6 kg    SpO2 97%    BMI 26.58 kg/m  Physical Exam Vitals and nursing note reviewed.  Constitutional:      General: He is not in acute distress.    Appearance: He is not ill-appearing, toxic-appearing or diaphoretic.     Comments: Patient smells strongly of marijuana   HENT:     Head: Normocephalic.  Eyes:     General: Lids are normal. No scleral icterus.       Right eye: Discharge present. No foreign body or hordeolum.        Left eye: No foreign body, discharge or hordeolum.     Intraocular pressure: Right eye pressure is 16 mmHg. Left eye pressure is 13 mmHg. Measurements were taken using a handheld tonometer.    Extraocular Movements: Extraocular movements intact.     Conjunctiva/sclera:     Right eye: Right conjunctiva is injected. No chemosis, exudate or hemorrhage.    Left eye: Left conjunctiva is injected. No chemosis, exudate or hemorrhage.    Pupils: Pupils are equal, round, and reactive to light.     Right eye: No corneal abrasion or fluorescein uptake. Seidel exam negative.     Left eye: No corneal abrasion or fluorescein  uptake. Seidel exam negative.    Comments: Conjunctivitis/sclera injected to bilateral eyes.  Patient noted to have thick green mucus in corners of bilateral eyes.  No pain with EOM  Visual acuity: Right eye 20/40; left eye 20/50  Cardiovascular:     Rate and Rhythm: Normal rate.  Pulmonary:     Effort: Pulmonary effort is normal.  Skin:    General: Skin is warm and dry.  Neurological:     General: No focal deficit present.     Mental Status: He is alert.  Psychiatric:        Behavior: Behavior is cooperative.    ED Results / Procedures / Treatments   Labs (all labs ordered are listed, but only abnormal results are displayed) Labs Reviewed - No data to  display  EKG None  Radiology No results found.  Procedures Procedures    Medications Ordered in ED Medications  erythromycin ophthalmic ointment (has no administration in time range)  fluorescein ophthalmic strip 1 strip (1 strip Both Eyes Given by Other 07/16/21 2053)  tetracaine (PONTOCAINE) 0.5 % ophthalmic solution 2 drop (2 drops Both Eyes Given by Other 07/16/21 2054)    ED Course/ Medical Decision Making/ A&P                           Medical Decision Making Risk Prescription drug management.   Alert 31 year old male no acute stress, nontoxic-appearing.  Presents with a chief complaint of bilateral eye redness, irritation, and drainage.  Information was sent from patient.  Past medical records reviewed including previous provider notes.  Patient has past medical history as noted in HPI which complicates care.  Patient has injection and purulent discharge noted to bilateral eyes.  No known injuries, foreign bodies, or chemical exposure.  Patient denies any diplopia or vision loss.  No fluorescein uptake on exam to indicate corneal abrasion.  Patient on patient's physical exam and history of illness concern for bacterial conjunctivitis.  Will give patient azithromycin ointment in the emergency department.  We will plan to discharge him with this medication to be used 4 times a day over the next 7 days.  If patient does not have improvement in symptoms in 2 days we will have him follow-up with ophthalmologist.  Discussed results, findings, treatment and follow up. Patient advised of return precautions. Patient verbalized understanding and agreed with plan.         Final Clinical Impression(s) / ED Diagnoses Final diagnoses:  Acute bacterial conjunctivitis of both eyes    Rx / DC Orders ED Discharge Orders     None         Dyann Ruddle 07/16/21 2158    Carmin Muskrat, MD 07/16/21 2248

## 2021-07-17 ENCOUNTER — Telehealth: Payer: Self-pay

## 2021-07-17 NOTE — Telephone Encounter (Signed)
Patient was seen last night for conjunctovitis. He called in today stating that he thought he was supposed to get a script for "eye drops". Read through notes, a tube of azythromycin was given to patient to use 4 x day. Return precautions to see opthalmology if did not improve in 2 days. Patient states "whatever" and hung up.

## 2021-10-30 ENCOUNTER — Other Ambulatory Visit: Payer: Self-pay

## 2021-10-30 ENCOUNTER — Encounter (HOSPITAL_COMMUNITY): Payer: Self-pay

## 2021-10-30 ENCOUNTER — Emergency Department (HOSPITAL_COMMUNITY)
Admission: EM | Admit: 2021-10-30 | Discharge: 2021-10-30 | Disposition: A | Discharge status: Home / Self Care | Payer: Self-pay | Attending: Emergency Medicine | Admitting: Emergency Medicine

## 2021-10-30 DIAGNOSIS — S60861A Insect bite (nonvenomous) of right wrist, initial encounter: Secondary | ICD-10-CM | POA: Insufficient documentation

## 2021-10-30 DIAGNOSIS — W57XXXA Bitten or stung by nonvenomous insect and other nonvenomous arthropods, initial encounter: Secondary | ICD-10-CM | POA: Insufficient documentation

## 2021-10-30 MED ORDER — DOXYCYCLINE HYCLATE 100 MG PO TABS
100.0000 mg | ORAL_TABLET | Freq: Once | ORAL | Status: AC
Start: 2021-10-30 — End: 2021-10-30
  Administered 2021-10-30: 100 mg via ORAL
  Filled 2021-10-30: qty 1

## 2021-10-30 MED ORDER — BACITRACIN ZINC 500 UNIT/GM EX OINT
TOPICAL_OINTMENT | CUTANEOUS | Status: AC
  Administered 2021-10-30: 1
  Filled 2021-10-30: qty 0.9

## 2021-10-30 MED ORDER — DOXYCYCLINE HYCLATE 100 MG PO CAPS: 100.0000 mg | ORAL_CAPSULE | Freq: Two times a day (BID) | ORAL | 0 refills | Status: AC

## 2021-10-30 NOTE — Discharge Instructions (Signed)
You should notice improvement of the swelling in the next 36 to 48 hours.  Take antibiotics as directed.  If your symptoms change or worsen, return to the emergency department.

## 2021-10-30 NOTE — ED Provider Notes (Signed)
[facility] Emergency Department Provider Note MRN:  [id number]  Arrival date & time: 10/30/21     Chief Complaint   Insect Bite   History of Present Illness   Tyler Rush is a 31 y.o. year-old male presents to the ED with chief complaint of wound on left wrist.  He states that he noticed it a few days ago and states that it looked like a small pimple.  He states that he popped it, and then a scab formed which was itchy and he scratched off.  Its that he now has some swelling around the wound and increased pain.  Denies any fever.  Denies any trouble moving his hand or wrist.  History provided by patient.   Review of Systems  Pertinent review of systems noted in HPI.    Physical Exam   Vitals:   10/30/21 0316  BP: 127/71  Pulse: 78  Resp: 18  Temp: 98.2 F (36.8 C)  SpO2: 97%    CONSTITUTIONAL: Nontoxic-appearing, NAD NEURO:  Alert and oriented x 3, CN 3-12 grossly intact EYES:  eyes equal and reactive ENT/NECK:  Supple, no stridor  CARDIO:  normal rate, regular rhythm, appears well-perfused  PULM:  No respiratory distress,  GI/GU:  non-distended,  MSK/SPINE:  No gross deformities, no edema, moves all extremities  SKIN:  1x1cm wound to left wrist as picture, non-fluctuant, no surrounding erythema    *Additional and/or pertinent findings included in MDM below  Diagnostic and Interventional Summary    EKG Interpretation  Date/Time:    Ventricular Rate:    PR Interval:    QRS Duration:   QT Interval:    QTC Calculation:   R Axis:     Text Interpretation:         Labs Reviewed - No data to display  No orders to display    Medications  doxycycline (VIBRA-TABS) tablet 100 mg (100 mg Oral Given 10/30/21 0341)     Procedures  /  Critical Care Procedures  ED Course and Medical Decision Making  I have reviewed the triage vital signs, the nursing notes, and pertinent available records from the EMR.  Social Determinants  Affecting Complexity of Care: Patient has no clinically significant social determinants affecting this chief complaint..   ED Course:   Patient here with wound to left wrist.  Top differential diagnoses include abscess, cellulitis, insect bite, injection site. Medical Decision Making Patient here with wound to left wrist, could be developing abscess or cellulitis.  Not very fluctuant, I doubt significant fluid collection.  We will trial antibiotic treatment.  Problems Addressed: Insect bite of right wrist, initial encounter: acute illness or injury  Risk Prescription drug management.     Consultants: No consultations were needed in caring for this patient.   Treatment and Plan: Emergency department workup does not suggest an emergent condition requiring admission or immediate intervention beyond  what has been performed at this time. The patient is safe for discharge and has  been instructed to return immediately for worsening symptoms, change in  symptoms or any other concerns    Final Clinical Impressions(s) / ED Diagnoses     ICD-10-CM   1. Insect bite of right wrist, initial encounter  [id number]    [email or web address]       ED Discharge Orders          Ordered    doxycycline (VIBRAMYCIN) 100 MG capsule  2 times daily        10/30/21 0342  Discharge Instructions Discussed with and Provided to Patient:     Discharge Instructions      You should notice improvement of the swelling in the next 36 to 48 hours.  Take antibiotics as directed.  If your symptoms change or worsen, return to the emergency department.       Roxy Horseman, PA-C 10/30/21 0354    Mesner, Barbara Cower, MD 10/30/21 [phone]

## 2021-10-30 NOTE — ED Triage Notes (Signed)
Ambulatory to ED with c/o possible bug bite to L wrist. States it started out a week ago as a little bite now progressing to a dime-size open wound. Denies fevers, chills.

## 2021-11-09 ENCOUNTER — Encounter (HOSPITAL_COMMUNITY): Payer: Self-pay | Admitting: Emergency Medicine

## 2021-11-09 ENCOUNTER — Emergency Department (HOSPITAL_COMMUNITY)
Admission: EM | Admit: 2021-11-09 | Discharge: 2021-11-09 | Disposition: A | Discharge status: Home / Self Care | Payer: Self-pay | Attending: Emergency Medicine | Admitting: Emergency Medicine

## 2021-11-09 ENCOUNTER — Other Ambulatory Visit: Payer: Self-pay

## 2021-11-09 DIAGNOSIS — W57XXXA Bitten or stung by nonvenomous insect and other nonvenomous arthropods, initial encounter: Secondary | ICD-10-CM | POA: Insufficient documentation

## 2021-11-09 DIAGNOSIS — S60862A Insect bite (nonvenomous) of left wrist, initial encounter: Secondary | ICD-10-CM | POA: Insufficient documentation

## 2021-11-09 DIAGNOSIS — Z5321 Procedure and treatment not carried out due to patient leaving prior to being seen by health care provider: Secondary | ICD-10-CM | POA: Insufficient documentation

## 2021-11-09 NOTE — ED Notes (Signed)
Called patient several times for a room patient didn't answer  

## 2021-11-09 NOTE — ED Triage Notes (Signed)
Pt has a known bug bite to the left wrist , is currently on antibiotics., ( has two days left ) wants to make sure he doesn't need something stronger

## 2022-03-25 ENCOUNTER — Ambulatory Visit
Admission: EM | Admit: 2022-03-25 | Discharge: 2022-03-25 | Disposition: A | Discharge status: Home / Self Care | Payer: Self-pay

## 2022-03-25 ENCOUNTER — Encounter: Payer: Self-pay | Admitting: Emergency Medicine

## 2022-03-25 DIAGNOSIS — R369 Urethral discharge, unspecified: Secondary | ICD-10-CM | POA: Insufficient documentation

## 2022-03-25 DIAGNOSIS — Z113 Encounter for screening for infections with a predominantly sexual mode of transmission: Secondary | ICD-10-CM | POA: Insufficient documentation

## 2022-03-25 NOTE — ED Triage Notes (Signed)
Pt is present today with c/o penile discharge(white) x3 days

## 2022-03-25 NOTE — Discharge Instructions (Signed)
Your STD test is pending.  We will call if it is positive and treat as appropriate.  We ask that you refrain from sexual activity until test results and treatment are complete.  Please follow-up if symptoms persist or worsen.

## 2022-03-25 NOTE — ED Provider Notes (Signed)
[facility]    CSN: [id number] Arrival date & time: 03/25/22  1103      History   Chief Complaint Chief Complaint  Patient presents with   Penile Discharge    HPI Tyler Rush is a 31 y.o. male.   Patient presents with white penile discharge that started about 3 days ago.  Patient denies dysuria, urinary frequency, testicular pain, abdominal pain, back pain, fever.  Denies any confirmed exposure to STD but has had unprotected sexual intercourse prior to symptoms starting.   Penile Discharge    Past Medical History:  Diagnosis Date   Asthma    GERD (gastroesophageal reflux disease)     There are no problems to display for this patient.   History reviewed. No pertinent surgical history.     Home Medications    Prior to Admission medications   Medication Sig Start Date End Date Taking? Authorizing Provider  gabapentin (NEURONTIN) 600 MG tablet Take 600 mg by mouth every morning. 10/26/21  Yes [provider]  traZODone (DESYREL) 50 MG tablet Take 50 mg by mouth at bedtime. 10/26/21  Yes [provider]  doxycycline (VIBRAMYCIN) 100 MG capsule Take 1 capsule (100 mg total) by mouth 2 (two) times daily. 10/30/21   Roxy Horseman, PA-C  naproxen sodium (ANAPROX) 220 MG tablet Take 220 mg by mouth as needed (pain/ headache).    [provider]  ondansetron (ZOFRAN ODT) 4 MG disintegrating tablet Take 1 tablet (4 mg total) by mouth every 8 (eight) hours as needed for nausea. 09/23/13   Eber Hong, MD    Family History Family History  Problem Relation Age of Onset   Diabetes Other    Cancer Other     Social History Social History   Tobacco Use   Smoking status: Every Day    Types: Cigarettes  Substance Use Topics   Alcohol use: Yes    Alcohol/week: 2.0 standard drinks of alcohol    Types: 2 Cans of beer per week    Comment: daily   Drug use: Yes    Frequency: 7.0 times per week    Types: Marijuana      Allergies   Asa [aspirin]   Review of Systems Review of Systems Per HPI  Physical Exam Triage Vital Signs ED Triage Vitals  Enc Vitals Group     BP 03/25/22 1118 137/83     Pulse Rate 03/25/22 1118 69     Resp 03/25/22 1118 18     Temp 03/25/22 1118 98 F (36.7 C)     Temp src --      SpO2 03/25/22 1118 98 %     Weight --      Height --      Head Circumference --      Peak Flow --      Pain Score 03/25/22 1117 0     Pain Loc --      Pain Edu? --      Excl. in GC? --    No data found.  Updated Vital Signs BP 137/83   Pulse 69   Temp 98 F (36.7 C)   Resp 18   SpO2 98%   Visual Acuity Right Eye Distance:   Left Eye Distance:   Bilateral Distance:    Right Eye Near:   Left Eye Near:    Bilateral Near:     Physical Exam Constitutional:      General: He is not in acute distress.  Appearance: Normal appearance. He is not toxic-appearing or diaphoretic.  HENT:     Head: Normocephalic and atraumatic.  Eyes:     Extraocular Movements: Extraocular movements intact.     Conjunctiva/sclera: Conjunctivae normal.  Pulmonary:     Effort: Pulmonary effort is normal.  Genitourinary:    Comments: Deferred with shared decision-making.  Self swab performed. Neurological:     General: No focal deficit present.     Mental Status: He is alert and oriented to person, place, and time. Mental status is at baseline.  Psychiatric:        Mood and Affect: Mood normal.        Behavior: Behavior normal.        Thought Content: Thought content normal.        Judgment: Judgment normal.      UC Treatments / Results  Labs (all labs ordered are listed, but only abnormal results are displayed) Labs Reviewed  CYTOLOGY, (ORAL, ANAL, URETHRAL) ANCILLARY ONLY    EKG   Radiology No results found.  Procedures Procedures (including critical care time)  Medications Ordered in UC Medications - No data to display  Initial Impression / Assessment and Plan / UC  Course  I have reviewed the triage vital signs and the nursing notes.  Pertinent labs & imaging results that were available during my care of the patient were reviewed by me and considered in my medical decision making (see chart for details).     Suspicious of STD.  Cytology swab pending.  Will await results for further treatment given no confirmed exposure to STD.  Patient advised to refrain from sexual activity until test results and treatment are complete.  Discussed return precautions.  Patient verbalized understanding and was agreeable with plan. Final Clinical Impressions(s) / UC Diagnoses   Final diagnoses:  Penile discharge  Screening examination for venereal disease     Discharge Instructions      Your STD test is pending.  We will call if it is positive and treat as appropriate.  We ask that you refrain from sexual activity until test results and treatment are complete.  Please follow-up if symptoms persist or worsen.    ED Prescriptions   None    PDMP not reviewed this encounter.   Teodora Medici, [place] 03/25/22 1201

## 2022-03-27 ENCOUNTER — Telehealth: Payer: Self-pay | Admitting: Emergency Medicine

## 2022-03-27 LAB — CYTOLOGY, (ORAL, ANAL, URETHRAL) ANCILLARY ONLY
Chlamydia: NEGATIVE
Comment: NEGATIVE
Comment: NEGATIVE
Comment: NORMAL
Neisseria Gonorrhea: NEGATIVE
Trichomonas: POSITIVE — AB

## 2022-03-27 MED ORDER — METRONIDAZOLE 500 MG PO TABS: 2000.0000 mg | ORAL_TABLET | Freq: Once | ORAL | 0 refills | Status: AC
